# Patient Record
Sex: Male | Born: 1995 | Hispanic: Yes | Marital: Married | Smoking: Never smoker
Health system: Southern US, Community
[De-identification: ages and names within clinical notes are randomized; demographics above are authoritative.]

---
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
## 2015-06-18 ENCOUNTER — Encounter: Payer: Self-pay | Admitting: *Deleted

## 2015-06-18 ENCOUNTER — Emergency Department
Admission: EM | Admit: 2015-06-18 | Discharge: 2015-06-18 | Disposition: A | Discharge status: Home / Self Care | Payer: No Typology Code available for payment source | Attending: Emergency Medicine | Admitting: Emergency Medicine

## 2015-06-18 ENCOUNTER — Emergency Department: Payer: No Typology Code available for payment source

## 2015-06-18 DIAGNOSIS — Y999 Unspecified external cause status: Secondary | ICD-10-CM | POA: Insufficient documentation

## 2015-06-18 DIAGNOSIS — M62838 Other muscle spasm: Secondary | ICD-10-CM | POA: Insufficient documentation

## 2015-06-18 DIAGNOSIS — Y9389 Activity, other specified: Secondary | ICD-10-CM | POA: Diagnosis not present

## 2015-06-18 DIAGNOSIS — G44319 Acute post-traumatic headache, not intractable: Secondary | ICD-10-CM | POA: Insufficient documentation

## 2015-06-18 DIAGNOSIS — Y92410 Unspecified street and highway as the place of occurrence of the external cause: Secondary | ICD-10-CM | POA: Insufficient documentation

## 2015-06-18 DIAGNOSIS — M542 Cervicalgia: Secondary | ICD-10-CM | POA: Diagnosis present

## 2015-06-18 MED ORDER — MELOXICAM 15 MG PO TABS: 15.0000 mg | ORAL_TABLET | Freq: Every day | ORAL | Status: DC

## 2015-06-18 MED ORDER — METHOCARBAMOL 500 MG PO TABS: 500.0000 mg | ORAL_TABLET | Freq: Four times a day (QID) | ORAL | Status: DC
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
## 2015-06-18 NOTE — Discharge Instructions (Signed)

## 2018-09-01 ENCOUNTER — Other Ambulatory Visit: Payer: Self-pay

## 2018-09-01 ENCOUNTER — Emergency Department
Admission: EM | Admit: 2018-09-01 | Discharge: 2018-09-01 | Disposition: A | Discharge status: Home / Self Care | Payer: Self-pay | Attending: Emergency Medicine | Admitting: Emergency Medicine

## 2018-09-01 ENCOUNTER — Emergency Department: Payer: Self-pay

## 2018-09-01 DIAGNOSIS — Z79899 Other long term (current) drug therapy: Secondary | ICD-10-CM | POA: Insufficient documentation

## 2018-09-01 DIAGNOSIS — K760 Fatty (change of) liver, not elsewhere classified: Secondary | ICD-10-CM | POA: Insufficient documentation

## 2018-09-01 DIAGNOSIS — N200 Calculus of kidney: Secondary | ICD-10-CM | POA: Insufficient documentation

## 2018-09-01 DIAGNOSIS — R748 Abnormal levels of other serum enzymes: Secondary | ICD-10-CM | POA: Insufficient documentation

## 2018-09-01 LAB — CBC
HCT: 42.4 % (ref 39.0–52.0)
Hemoglobin: 14 g/dL (ref 13.0–17.0)
MCH: 29.3 pg (ref 26.0–34.0)
MCHC: 33 g/dL (ref 30.0–36.0)
MCV: 88.7 fL (ref 80.0–100.0)
Platelets: 165 10*3/uL (ref 150–400)
RBC: 4.78 MIL/uL (ref 4.22–5.81)
RDW: 13.4 % (ref 11.5–15.5)
WBC: 8.5 10*3/uL (ref 4.0–10.5)
nRBC: 0 % (ref 0.0–0.2)

## 2018-09-01 LAB — COMPREHENSIVE METABOLIC PANEL
ALT: 75 U/L — ABNORMAL HIGH (ref 0–44)
AST: 29 U/L (ref 15–41)
Albumin: 4.3 g/dL (ref 3.5–5.0)
Alkaline Phosphatase: 56 U/L (ref 38–126)
Anion gap: 8 (ref 5–15)
BUN: 24 mg/dL — ABNORMAL HIGH (ref 6–20)
CO2: 26 mmol/L (ref 22–32)
Calcium: 9.3 mg/dL (ref 8.9–10.3)
Chloride: 108 mmol/L (ref 98–111)
Creatinine, Ser: 0.86 mg/dL (ref 0.61–1.24)
GFR calc Af Amer: >60 mL/min (ref >60)
GFR calc non Af Amer: >60 mL/min (ref >60)
Glucose, Bld: 94 mg/dL (ref 70–99)
Potassium: 4.3 mmol/L (ref 3.5–5.1)
Sodium: 142 mmol/L (ref 135–145)
Total Bilirubin: 0.7 mg/dL (ref 0.3–1.2)
Total Protein: 7.2 g/dL (ref 6.5–8.1)

## 2018-09-01 LAB — URINALYSIS, COMPLETE (UACMP) WITH MICROSCOPIC
Bacteria, UA: NONE SEEN
Bilirubin Urine: NEGATIVE
Glucose, UA: NEGATIVE mg/dL
Ketones, ur: 5 mg/dL — AB
Leukocytes,Ua: NEGATIVE
Nitrite: NEGATIVE
Protein, ur: 30 mg/dL — AB
Specific Gravity, Urine: 1.035 — ABNORMAL HIGH (ref 1.005–1.030)
pH: 5 (ref 5.0–8.0)

## 2018-09-01 LAB — LIPASE, BLOOD: Lipase: 29 U/L (ref 11–51)

## 2018-09-01 MED ORDER — IOHEXOL 300 MG/ML  SOLN
100.0000 mL | Freq: Once | INTRAMUSCULAR | Status: AC | PRN
  Administered 2018-09-01: 100 mL via INTRAVENOUS
  Filled 2018-09-01: qty 100

## 2018-09-01 MED ORDER — MELOXICAM 7.5 MG PO TABS: 7.5000 mg | ORAL_TABLET | Freq: Every day | ORAL | 0 refills | Status: AC

## 2018-09-01 NOTE — Discharge Instructions (Signed)
Your CT shows that you have a tiny left kidney stone, which should pass without difficulty.  One of your liver enzymes was also elevated and your CT shows that you have some fat in your liver.  You will need to have this rechecked so I have given you a referral to the GI specialist.  You will also need to follow-up with primary care for your kidney stone and your hand pain.  Please [facility] with [facility] or [facility].  You can call the GI doctor and the primary care office tomorrow for appointments.  Do not take any additional over-the-counter medication.

## 2018-09-01 NOTE — ED Notes (Signed)
See triage note  Presents with pain to lower back off and on for about 1 week  Denies any injury or urinary sx's  Afebrile on arrival

## 2018-09-01 NOTE — ED Provider Notes (Signed)
[facility] Emergency Department Provider Note  ____________________________________________  Time seen: Approximately 6:08 PM  I have reviewed the triage vital signs and the nursing notes.   HISTORY  Chief Complaint Flank Pain    HPI Hunter Poss. is a 23 y.o. male that presents to the emergency department for evaluation of bilateral low back pain for 2 days and LUQ pain for 1 month. LUQ pain happens right after he eats.  Abdominal pain does not seem to change with type of food.  Abdominal pain is sharp in character when it happens. He feels bloated frequently.  Bilateral low back pain started 2 days ago.  Pain does not radiate into his stomach or down his legs.  No bowel or bladder dysfunction or saddle anesthesias.  He is currently taking a medication called "Arthritis Edison Pace" for left hand pain that he has had for 1 year.  No dietary or exercise changes. No trauma.  No pain currently.  No vomiting, dysuria, hematuria diarrhea, constipation.    History reviewed. No pertinent past medical history.  There are no active problems to display for this patient.   History reviewed. No pertinent surgical history.  Prior to Admission medications   Medication Sig Start Date End Date Taking? Authorizing Provider  meloxicam (MOBIC) 7.5 MG tablet Take 1 tablet (7.5 mg total) by mouth daily. 09/01/18 09/01/19  Laban Emperor, PA-C    Allergies Patient has no known allergies.  No family history on file.  Social History Social History   Tobacco Use  . Smoking status: Never Smoker  Substance Use Topics  . Alcohol use: No  . Drug use: Not on file     Review of Systems  Constitutional: No fever/chills Cardiovascular: No chest pain. Respiratory: No SOB. Gastrointestinal: Positive for LUQ pain. No nausea, no vomiting.  Genitourinary: Negative for dysuria. Musculoskeletal: Positive for low back pain.  Skin: Negative for rash, abrasions, lacerations,  ecchymosis.   ____________________________________________   PHYSICAL EXAM:  VITAL SIGNS: ED Triage Vitals  Enc Vitals Group     BP 09/01/18 1713 114/62     Pulse Rate 09/01/18 1713 82     Resp 09/01/18 1713 16     Temp 09/01/18 1713 98.9 F (37.2 C)     Temp Source 09/01/18 1713 Oral     SpO2 09/01/18 1713 97 %     Weight 09/01/18 1714 148 lb (67.1 kg)     Height 09/01/18 1714 5\' 7"  (1.702 m)     Head Circumference --      Peak Flow --      Pain Score 09/01/18 1714 6     Pain Loc --      Pain Edu? --      Excl. in [place]? --      Constitutional: Alert and oriented. Well appearing and in no acute distress. Eyes: Conjunctivae are normal. PERRL. EOMI. Head: Atraumatic. ENT:      Ears:      Nose: No congestion/rhinnorhea.      Mouth/Throat: Mucous membranes are moist.  Neck: No stridor. Cardiovascular: Normal rate, regular rhythm.  Good peripheral circulation. Respiratory: Normal respiratory effort without tachypnea or retractions. Lungs CTAB. Good air entry to the bases with no decreased or absent breath sounds. Gastrointestinal: Bowel sounds 4 quadrants. Soft and nontender to palpation. No guarding or rigidity. No palpable masses. No distention. No CVA tenderness. Musculoskeletal: Full range of motion to all extremities. No gross deformities appreciated. Neurologic:  Normal speech and language.  No gross focal neurologic deficits are appreciated.  Skin:  Skin is warm, dry and intact. No rash noted. Psychiatric: Mood and affect are normal. Speech and behavior are normal. Patient exhibits appropriate insight and judgement.   ____________________________________________   LABS (all labs ordered are listed, but only abnormal results are displayed)  Labs Reviewed  URINALYSIS, COMPLETE (UACMP) WITH MICROSCOPIC - Abnormal; Notable for the following components:      Result Value   Color, Urine YELLOW (*)    APPearance CLEAR (*)    Specific Gravity, Urine 1.035 (*)    Hgb  urine dipstick SMALL (*)    Ketones, ur 5 (*)    Protein, ur 30 (*)    All other components within normal limits  COMPREHENSIVE METABOLIC PANEL - Abnormal; Notable for the following components:   BUN 24 (*)    ALT 75 (*)    All other components within normal limits  CBC  LIPASE, BLOOD   ____________________________________________  EKG   ____________________________________________  RADIOLOGY   Ct Abdomen Pelvis W Contrast  Result Date: 09/01/2018 CLINICAL DATA:  Bilateral flank pain EXAM: CT ABDOMEN AND PELVIS WITH CONTRAST TECHNIQUE: Multidetector CT imaging of the abdomen and pelvis was performed using the standard protocol following bolus administration of intravenous contrast. CONTRAST:  100mL OMNIPAQUE IOHEXOL 300 MG/ML  SOLN COMPARISON:  None. FINDINGS: Lower chest: No acute abnormality. Hepatobiliary: Gallbladder is within normal limits. Fatty infiltration of the liver is noted. Pancreas: Unremarkable. No pancreatic ductal dilatation or surrounding inflammatory changes. Spleen: Normal in size without focal abnormality. Adrenals/Urinary Tract: Adrenal glands are within normal limits. Kidneys demonstrate a normal enhancement pattern bilaterally. Tiny nonobstructing lower pole left renal stone is seen. No definitive ureteral stones are seen. No obstructive changes are noted. The bladder is decompressed. Stomach/Bowel: Appendix is not well visualized. No inflammatory changes are identified to suggest appendicitis. No obstructive or inflammatory changes of the larger small-bowel are seen. Stomach is within normal limits. Vascular/Lymphatic: No significant vascular findings are present. No enlarged abdominal or pelvic lymph nodes. Reproductive: Prostate is unremarkable. Other: No abdominal wall hernia or abnormality. No abdominopelvic ascites. Musculoskeletal: No acute or significant osseous findings. IMPRESSION: Tiny nonobstructing left lower pole renal stone. No obstructive changes are  noted. Fatty liver Electronically Signed   By: Alcide CleverMark  Lukens M.D.   On: 09/01/2018 20:11    ____________________________________________    PROCEDURES  Procedure(s) performed:    Procedures    Medications  iohexol (OMNIPAQUE) 300 MG/ML solution 100 mL (100 mLs Intravenous Contrast Given 09/01/18 1958)     ____________________________________________   INITIAL IMPRESSION / ASSESSMENT AND PLAN / ED COURSE  Pertinent labs & imaging results that were available during my care of the patient were reviewed by me and considered in my medical decision making (see chart for details).  Review of the [place] CSRS was performed in accordance of the [facility] prior to dispensing any controlled drugs.     Patient presented to emergency department for evaluation of left upper quadrant pain for 1 month and low back pain for 2 days.  Vital signs and exam are reassuring.  Abdominal exam is benign.  Patient had mildly elevated ALT and fatty liver on CT scan.  He will stop taking his arthritis King medication until further evaluation.  Patient was given referral for gastroenterology that he will call tomorrow for follow-up.  CT scan also consistent with left tiny nonobstructing renal stone.  No indication of infection on urinalysis.  Patient appears well and would  like to go home.  Patient will be discharged home with prescriptions for a short course of Mobic. Patient is to follow up with primary care and gastroenterology as directed. Patient is given ED precautions to return to the ED for any worsening or new symptoms.   Hunter PianOscar M Dunnigan Jr. was evaluated in Emergency Department on 09/01/2018 for the symptoms described in the history of present illness. He was evaluated in the context of the global COVID-19 pandemic, which necessitated consideration that the patient might be at risk for infection with the SARS-CoV-2 virus that causes COVID-19. Institutional protocols and algorithms that pertain to the evaluation of  patients at risk for COVID-19 are in a state of rapid change based on information released by regulatory bodies including the CDC and federal and state organizations. These policies and algorithms were followed during the patient's care in the ED.  ____________________________________________  FINAL CLINICAL IMPRESSION(S) / ED DIAGNOSES  Final diagnoses:  Fatty liver  Kidney stone on left side  Elevated liver enzymes      NEW MEDICATIONS STARTED DURING THIS VISIT:  ED Discharge Orders         Ordered    meloxicam (MOBIC) 7.5 MG tablet  Daily     09/01/18 2023              This chart was dictated using voice recognition software/Dragon. Despite best efforts to proofread, errors can occur which can change the meaning. Any change was purely unintentional.    Enid DerryWagner, Josalin Carneiro, PA-C 09/01/18 2329    Emily FilbertWilliams, Jonathan E, MD 09/04/18 [phone]

## 2018-09-01 NOTE — ED Triage Notes (Signed)
Bilateral flank pain intermittently throughout the week. Denies urinary sx at this time. Pt alert and oriented X4, active, cooperative, pt in NAD. RR even and unlabored, color WNL.

## 2018-11-11 ENCOUNTER — Ambulatory Visit: Payer: Self-pay | Admitting: Gastroenterology

## 2018-12-23 ENCOUNTER — Ambulatory Visit: Payer: Self-pay | Admitting: Gastroenterology

## 2018-12-23 ENCOUNTER — Encounter: Payer: Self-pay | Admitting: Gastroenterology

## 2018-12-23 DIAGNOSIS — K760 Fatty (change of) liver, not elsewhere classified: Secondary | ICD-10-CM

## 2021-05-15 ENCOUNTER — Emergency Department (HOSPITAL_COMMUNITY)
Admission: EM | Admit: 2021-05-15 | Discharge: 2021-05-15 | Disposition: A | Discharge status: Home / Self Care | Payer: Self-pay | Attending: Emergency Medicine | Admitting: Emergency Medicine

## 2021-05-15 ENCOUNTER — Emergency Department (HOSPITAL_COMMUNITY): Payer: Self-pay

## 2021-05-15 ENCOUNTER — Encounter (HOSPITAL_COMMUNITY): Payer: Self-pay

## 2021-05-15 DIAGNOSIS — M940 Chondrocostal junction syndrome [Tietze]: Secondary | ICD-10-CM | POA: Insufficient documentation

## 2021-05-15 LAB — BASIC METABOLIC PANEL
Anion gap: 6 (ref 5–15)
BUN: 11 mg/dL (ref 6–20)
CO2: 29 mmol/L (ref 22–32)
Calcium: 9.8 mg/dL (ref 8.9–10.3)
Chloride: 103 mmol/L (ref 98–111)
Creatinine, Ser: 0.81 mg/dL (ref 0.61–1.24)
GFR, Estimated: >60 mL/min (ref >60)
Glucose, Bld: 91 mg/dL (ref 70–99)
Potassium: 3.8 mmol/L (ref 3.5–5.1)
Sodium: 138 mmol/L (ref 135–145)

## 2021-05-15 LAB — CBC
HCT: 46.3 % (ref 39.0–52.0)
Hemoglobin: 15.7 g/dL (ref 13.0–17.0)
MCH: 29 pg (ref 26.0–34.0)
MCHC: 33.9 g/dL (ref 30.0–36.0)
MCV: 85.6 fL (ref 80.0–100.0)
Platelets: 192 10*3/uL (ref 150–400)
RBC: 5.41 MIL/uL (ref 4.22–5.81)
RDW: 12.7 % (ref 11.5–15.5)
WBC: 8.3 10*3/uL (ref 4.0–10.5)
nRBC: 0 % (ref 0.0–0.2)

## 2021-05-15 LAB — TROPONIN I (HIGH SENSITIVITY): Troponin I (High Sensitivity): <2 ng/L (ref <18)

## 2021-05-15 MED ORDER — DICLOFENAC SODIUM 1 % EX GEL: 2.0000 g | Freq: Four times a day (QID) | CUTANEOUS | 0 refills | Status: DC

## 2021-05-15 MED ORDER — DICLOFENAC SODIUM 1 % EX GEL: 2.0000 g | Freq: Four times a day (QID) | CUTANEOUS | 0 refills | Status: AC

## 2021-05-15 MED ORDER — DEXAMETHASONE SODIUM PHOSPHATE 10 MG/ML IJ SOLN
10.0000 mg | Freq: Once | INTRAMUSCULAR | Status: AC
  Administered 2021-05-15: 10 mg via INTRAMUSCULAR
  Filled 2021-05-15: qty 1

## 2021-05-15 MED ORDER — MELOXICAM 7.5 MG PO TABS: 7.5000 mg | ORAL_TABLET | Freq: Every day | ORAL | 0 refills | Status: AC

## 2021-05-15 MED ORDER — MELOXICAM 7.5 MG PO TABS: 7.5000 mg | ORAL_TABLET | Freq: Every day | ORAL | 0 refills | Status: DC

## 2021-05-15 NOTE — Discharge Instructions (Signed)
You are seen in the emergency room today with chest discomfort.  Lab work here is reassuring.  I suspect that you have got pain in your chest due to inflammation in your chest wall.  I am giving you a dose of steroid and will start you on medication for the next 2 weeks.  Please establish care with a PCP and return with any new or suddenly worsening symptoms. ?
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
## 2021-05-15 NOTE — ED Provider Notes (Signed)
? ?Emergency Department Provider Note ? ? ?I have reviewed the triage vital signs and the nursing notes. ? ? ?HISTORY ? ?Chief Complaint ?Chest Pain ? ? ?HPI ?Hunter Duffy. is a 26 y.o. male presents to the ED with CP x 6 months to the anterior chest. Patient describes pain with palpation. No fever. No chills. Was placed on naproxen x 2 weeks with some improvement but symptoms returned. No SOB. No injury.  ? ?History reviewed. No pertinent past medical history. ? ?Review of Systems ? ?Constitutional: No fever/chills ?Eyes: No visual changes. ?ENT: No sore throat. ?Cardiovascular: Positive chest pain. ?Respiratory: Denies shortness of breath. ?Gastrointestinal: No abdominal pain.  No nausea, no vomiting.  No diarrhea.  No constipation. ?Genitourinary: Negative for dysuria. ?Musculoskeletal: Negative for back pain. ?Skin: Negative for rash. ?Neurological: Negative for headaches. ? ? ?____________________________________________ ? ? ?PHYSICAL EXAM: ? ?VITAL SIGNS: ?ED Triage Vitals [05/15/21 1544]  ?Enc Vitals Group  ?   BP 127/82  ?   Pulse Rate 88  ?   Resp 14  ?   Temp 98.1 ?F (36.7 ?C)  ?   Temp Source Oral  ?   SpO2 100 %  ?   Weight 165 lb (74.8 kg)  ?   Height 5\' 9"  (1.753 m)  ? ?Constitutional: Alert and oriented. Well appearing and in no acute distress. ?Eyes: Conjunctivae are normal.  ?Head: Atraumatic. ?Nose: No congestion/rhinnorhea. ?Mouth/Throat: Mucous membranes are moist.   ?Neck: No stridor.   ?Cardiovascular: Normal rate, regular rhythm. Good peripheral circulation. Grossly normal heart sounds.   ?Respiratory: Normal respiratory effort.  No retractions. Lungs CTAB. ?Gastrointestinal: Soft and nontender. No distention.  ?Musculoskeletal: No gross deformities of extremities. ?Neurologic:  Normal speech and language.  ?Skin:  Skin is warm, dry and intact. No rash noted. ? ?____________________________________________ ?  ?LABS ?(all labs ordered are listed, but only abnormal results are  displayed) ? ?Labs Reviewed  ?BASIC METABOLIC PANEL  ?CBC  ?TROPONIN I (HIGH SENSITIVITY)  ?TROPONIN I (HIGH SENSITIVITY)  ? ?____________________________________________ ? ?EKG ? ? EKG Interpretation ? ?Date/Time:  Monday May 15 2021 15:45:05 EDT ?Ventricular Rate:  89 ?PR Interval:  135 ?QRS Duration: 94 ?QT Interval:  347 ?QTC Calculation: 423 ?R Axis:   72 ?Text Interpretation: Sinus rhythm Confirmed by Nanda Quinton [id number]) on 05/15/2021 7:30:10 PM ?  ? ?  ? ? ?____________________________________________ ? ?RADIOLOGY ? ?DG Chest 2 View ? ?Result Date: 05/15/2021 ?CLINICAL DATA:  Chest pain. EXAM: CHEST - 2 VIEW COMPARISON:  None. FINDINGS: The heart size and mediastinal contours are within normal limits. Both lungs are clear. The visualized skeletal structures are unremarkable. IMPRESSION: No active cardiopulmonary disease. Electronically Signed   By: Ronney Asters M.D.   On: 05/15/2021 17:05   ? ?____________________________________________ ? ? ?PROCEDURES ? ?Procedure(s) performed:  ? ?Procedures ? ?None ?____________________________________________ ? ? ?INITIAL IMPRESSION / ASSESSMENT AND PLAN / ED COURSE ? ?Pertinent labs & imaging results that were available during my care of the patient were reviewed by me and considered in my medical decision making (see chart for details). ?  ?This patient is Presenting for Evaluation of CP, which does require a range of treatment options, and is a complaint that involves a high risk of morbidity and mortality. ? ?The Differential Diagnoses includes all life-threatening causes for chest pain. This includes but is not exclusive to acute coronary syndrome, aortic dissection, pulmonary embolism, cardiac tamponade, community-acquired pneumonia, pericarditis, musculoskeletal chest wall pain, etc. ? ?I decided to  review pertinent External Data, and in summary no recent ED charts in our system. ?  ?Clinical Laboratory Tests Ordered, included troponin is negative. BMP and CBC  are WNL.  ? ?Radiologic Tests Ordered, included CXR. I independently interpreted the images and agree with radiology interpretation.  ? ?Cardiac Monitor Tracing which shows NSR. ? ? ?Social Determinants of Health Risk denies smoking history.  ? ?Medical Decision Making: Summary:  ?Patient's CP is atypical for ACS or PE. Exam seems most consistent with costochondritis. Plan for NSAIDs and PCP follow up.   ? ?Reevaluation with update and discussion with patient regarding labs and imaging. Plan for NSAIDs and PCP follow up.  ? ?Disposition: discharge ? ?____________________________________________ ? ?FINAL CLINICAL IMPRESSION(S) / ED DIAGNOSES ? ?Final diagnoses:  ?Costochondritis  ? ? ? ?NEW OUTPATIENT MEDICATIONS STARTED DURING THIS VISIT: ? ?Discharge Medication List as of 05/15/2021  8:20 PM  ?  ? ?START taking these medications  ? Details  ?diclofenac Sodium (VOLTAREN) 1 % GEL Apply 2 g topically 4 (four) times daily., Starting Mon 05/15/2021, Normal  ?  ?meloxicam (MOBIC) 7.5 MG tablet Take 1 tablet (7.5 mg total) by mouth daily for 14 days., Starting Mon 05/15/2021, Until Mon 05/29/2021, Normal  ?  ?  ? ? ?Note:  This document was prepared using Dragon voice recognition software and may include unintentional dictation errors. ? ?Nanda Quinton, MD, FACEP ?Emergency Medicine ? ?  ?Margette Fast, MD ?05/16/21 1210 ? ?

## 2021-05-15 NOTE — ED Triage Notes (Addendum)
Pt arrived via POV, c/o sternal chest pain, that started around 6 months ago. Pain intermittent. Worsening with palpation.  ?

## 2023-03-30 IMAGING — CR DG CHEST 2V
2 series · 2 of 2 positions shown · non-contrast
Comparison: None.

CLINICAL DATA: Chest pain.

EXAM:
CHEST - 2 VIEW

[w chest pa]
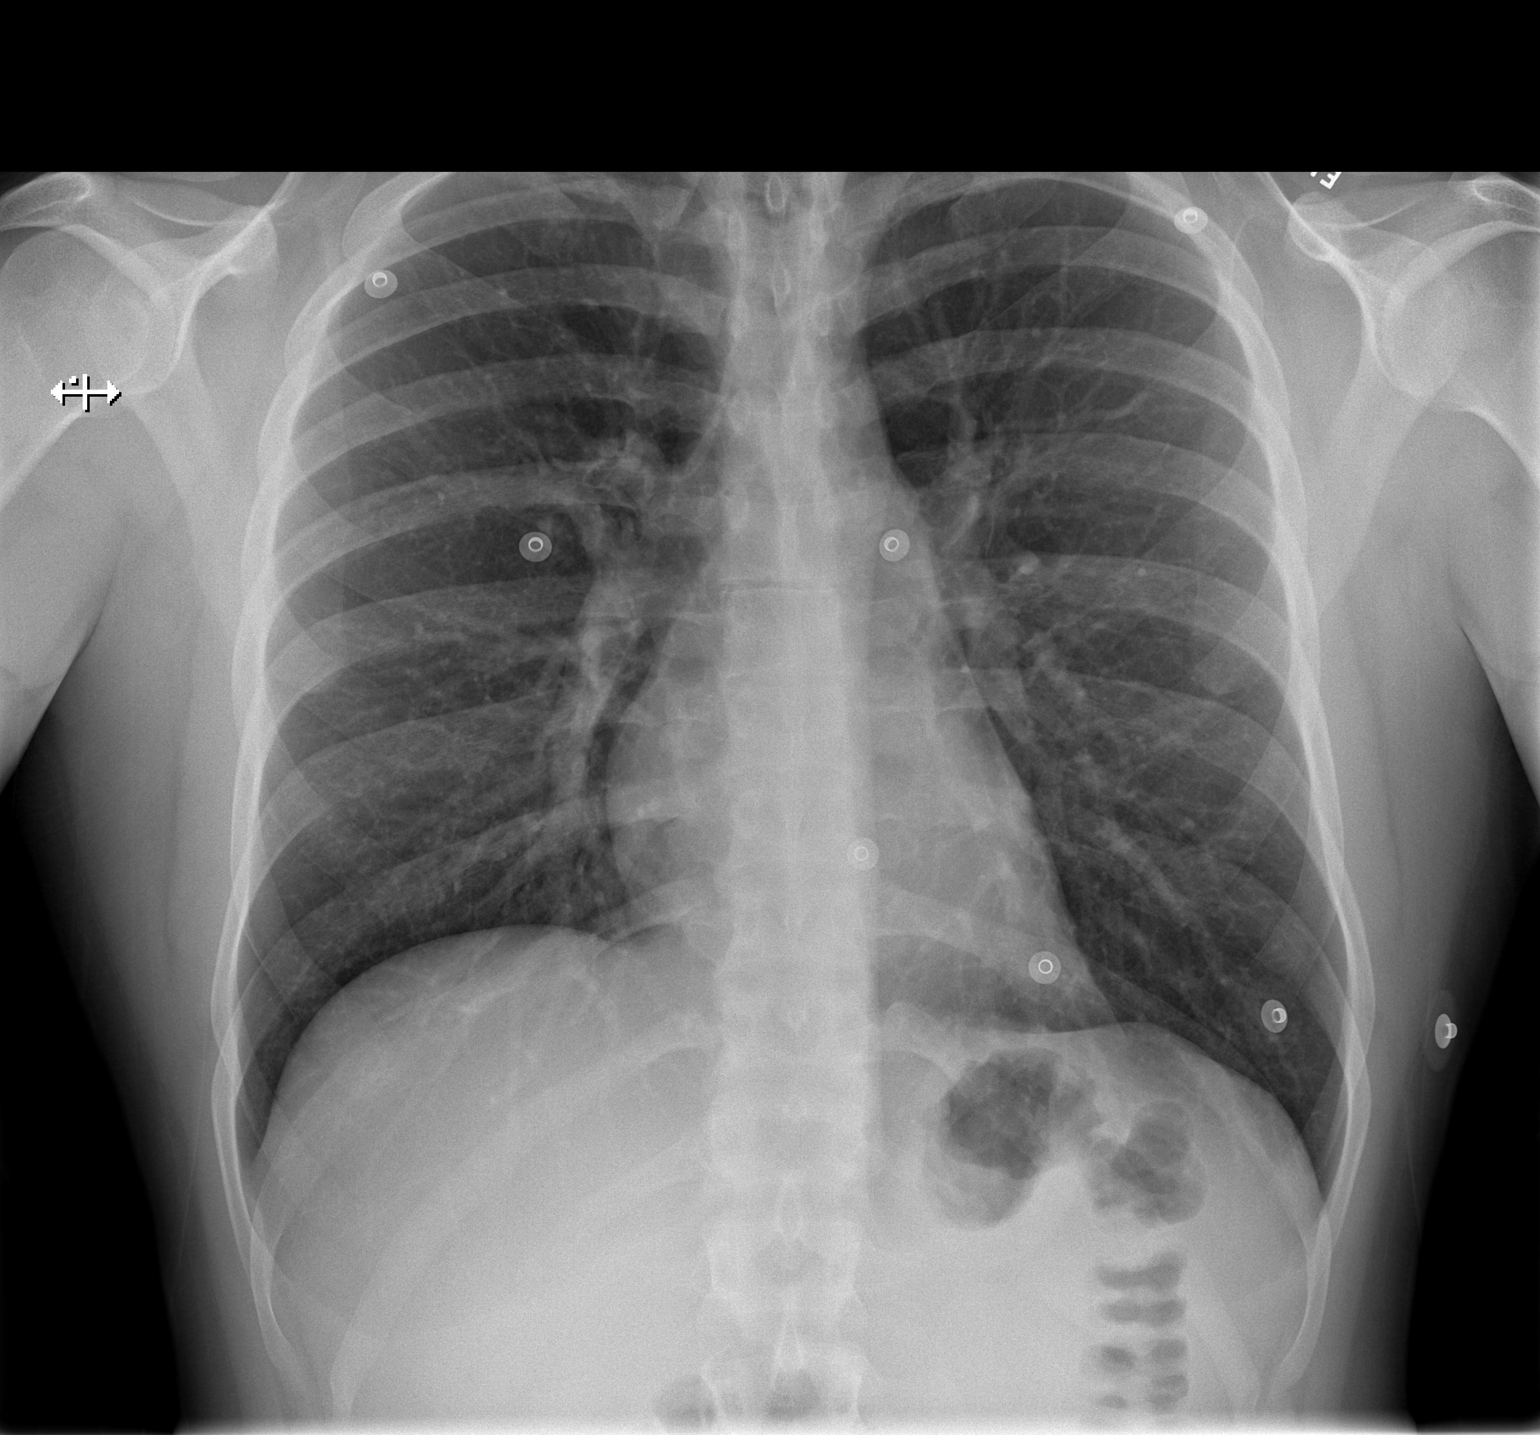

[w chest lat]
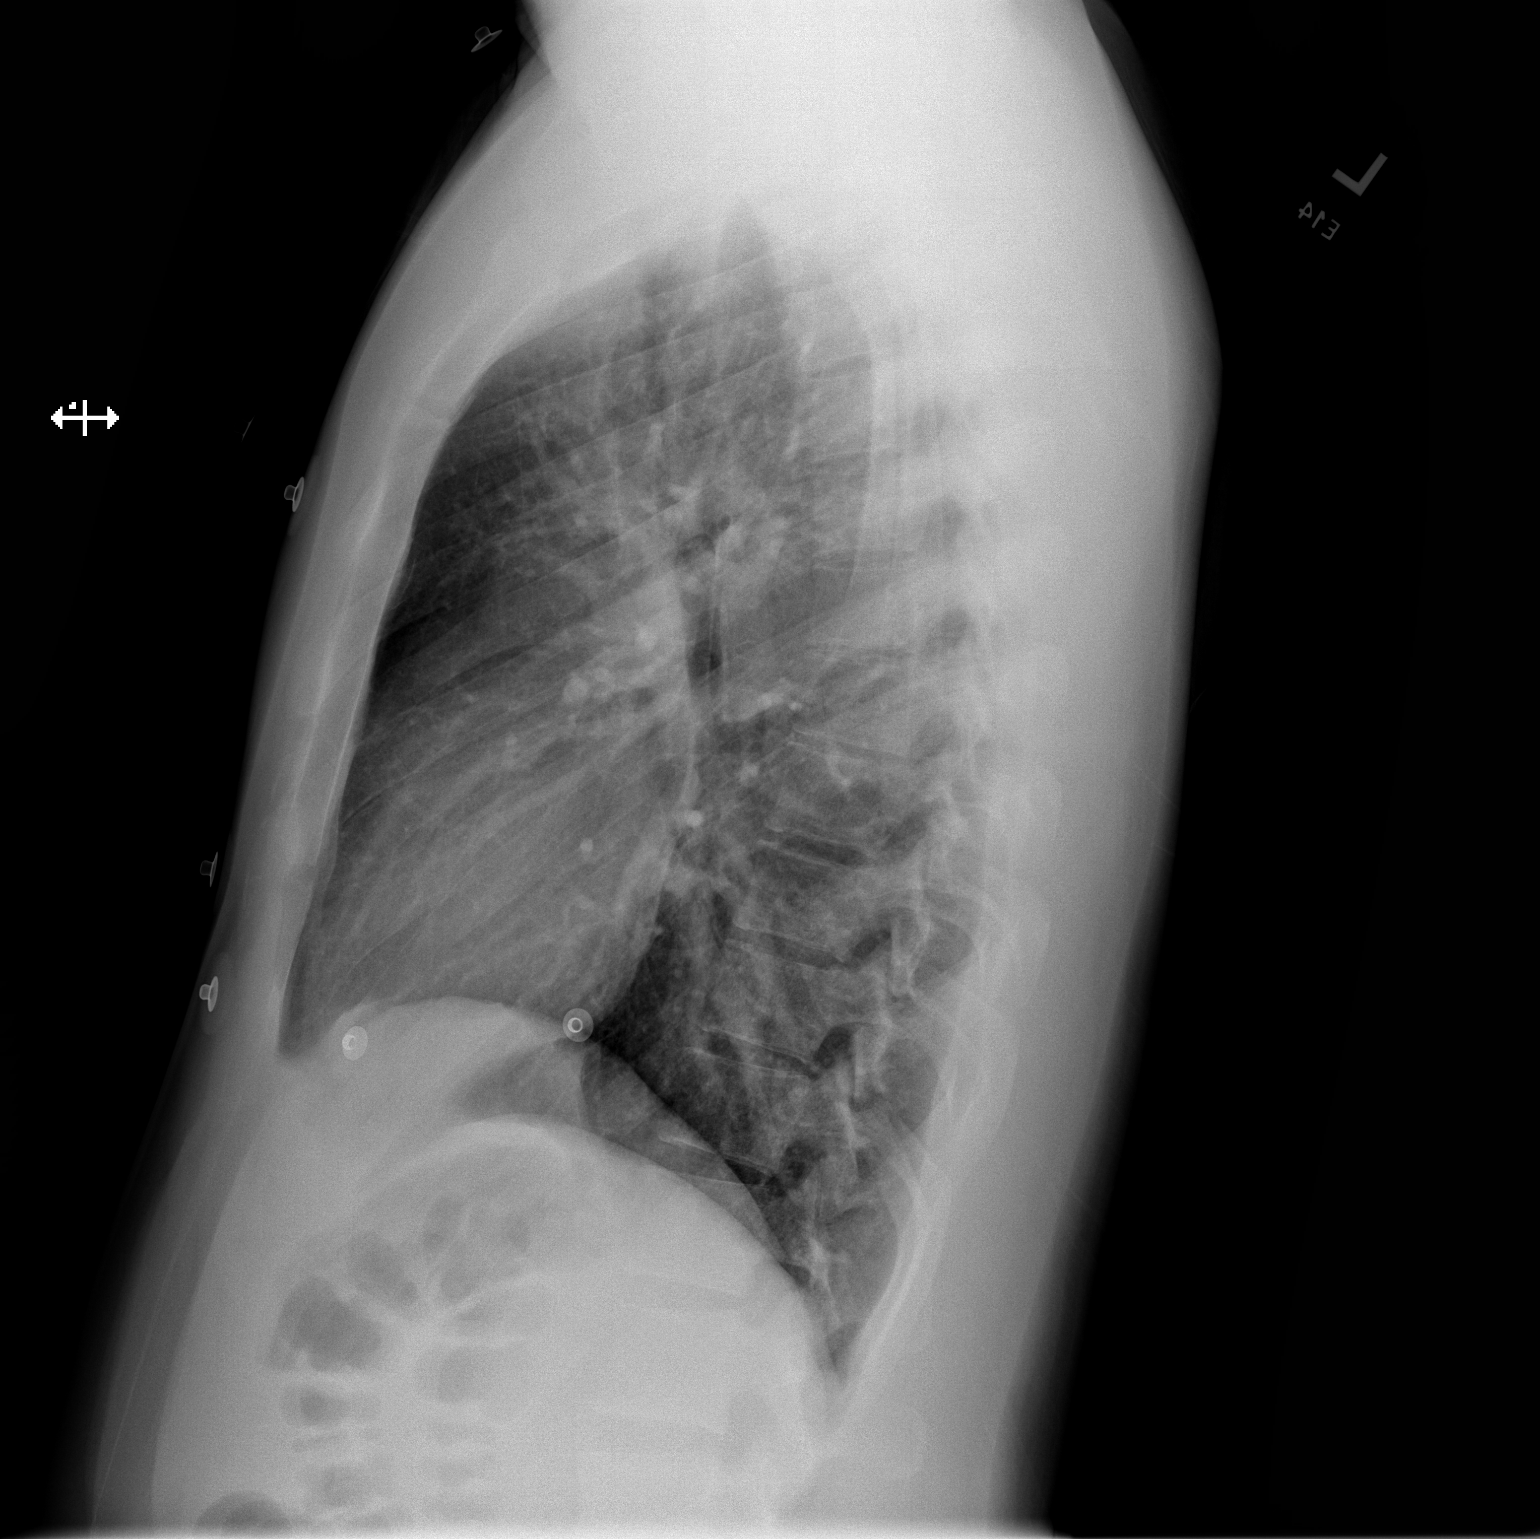

[2 of 2 positions shown; findings below may reference images not displayed]

FINDINGS: The heart size and mediastinal contours are within normal limits.
Both lungs are clear. The visualized skeletal structures are
unremarkable.
IMPRESSION: No active cardiopulmonary disease.
